# Patient Record
Sex: Female | Born: 2005
Health system: Southern US, Community
[De-identification: ages and names within clinical notes are randomized; demographics above are authoritative.]

## PROBLEM LIST (undated history)

## (undated) ENCOUNTER — Ambulatory Visit: Admission: EM | Payer: Commercial Managed Care - PPO | Source: Home / Self Care

## (undated) HISTORY — PX: TYMPANOSTOMY TUBE PLACEMENT: SHX32

---
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
## 2024-06-01 ENCOUNTER — Encounter

## 2024-12-11 ENCOUNTER — Other Ambulatory Visit

## 2024-12-18 ENCOUNTER — Encounter: Admitting: Family Medicine
